# Patient Record
Sex: Male | Born: 2000 | Race: White | Hispanic: No | Marital: Single | Smoking: Never smoker
Health system: Southern US, Community
[De-identification: ages and names within clinical notes are randomized; demographics above are authoritative.]

## PROBLEM LIST (undated history)

## (undated) DIAGNOSIS — F909 Attention-deficit hyperactivity disorder, unspecified type: Secondary | ICD-10-CM

## (undated) DIAGNOSIS — E079 Disorder of thyroid, unspecified: Secondary | ICD-10-CM

## (undated) DIAGNOSIS — J189 Pneumonia, unspecified organism: Secondary | ICD-10-CM

## (undated) HISTORY — PX: ORCHIECTOMY: SHX2116

---
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
## 2022-04-22 ENCOUNTER — Other Ambulatory Visit (INDEPENDENT_AMBULATORY_CARE_PROVIDER_SITE_OTHER): Payer: Self-pay | Admitting: Family

## 2022-06-23 ENCOUNTER — Emergency Department (HOSPITAL_BASED_OUTPATIENT_CLINIC_OR_DEPARTMENT_OTHER): Payer: Medicaid Other

## 2022-06-23 ENCOUNTER — Emergency Department (HOSPITAL_BASED_OUTPATIENT_CLINIC_OR_DEPARTMENT_OTHER)
Admission: EM | Admit: 2022-06-23 | Discharge: 2022-06-23 | Disposition: A | Discharge status: Home / Self Care | Payer: Medicaid Other | Attending: Emergency Medicine | Admitting: Emergency Medicine

## 2022-06-23 ENCOUNTER — Other Ambulatory Visit: Payer: Self-pay

## 2022-06-23 ENCOUNTER — Encounter (HOSPITAL_BASED_OUTPATIENT_CLINIC_OR_DEPARTMENT_OTHER): Payer: Self-pay | Admitting: Emergency Medicine

## 2022-06-23 DIAGNOSIS — S8992XA Unspecified injury of left lower leg, initial encounter: Secondary | ICD-10-CM | POA: Insufficient documentation

## 2022-06-23 DIAGNOSIS — S6992XA Unspecified injury of left wrist, hand and finger(s), initial encounter: Secondary | ICD-10-CM | POA: Diagnosis present

## 2022-06-23 DIAGNOSIS — M25532 Pain in left wrist: Secondary | ICD-10-CM | POA: Diagnosis not present

## 2022-06-23 DIAGNOSIS — W19XXXA Unspecified fall, initial encounter: Secondary | ICD-10-CM | POA: Diagnosis not present

## 2022-06-23 DIAGNOSIS — S52572A Other intraarticular fracture of lower end of left radius, initial encounter for closed fracture: Secondary | ICD-10-CM

## 2022-06-23 HISTORY — DX: Disorder of thyroid, unspecified: E07.9

## 2022-06-23 MED ORDER — OXYCODONE-ACETAMINOPHEN 5-325 MG PO TABS
1.0000 | ORAL_TABLET | Freq: Once | ORAL | Status: AC
  Administered 2022-06-23: 1 via ORAL
  Filled 2022-06-23: qty 1
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
## 2022-06-23 NOTE — ED Notes (Signed)
D/c paperwork reviewed with pt, including f/u care. Pt verbalized understanding, no questions or concerns at time of d/c. Ambulatory to ED exit to meet ride.
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
## 2022-06-23 NOTE — ED Notes (Signed)
X-ray at bedside

## 2022-06-24 ENCOUNTER — Ambulatory Visit (INDEPENDENT_AMBULATORY_CARE_PROVIDER_SITE_OTHER): Payer: Medicaid Other | Admitting: Physician Assistant

## 2022-06-24 ENCOUNTER — Encounter: Payer: Self-pay | Admitting: Physician Assistant

## 2022-06-24 ENCOUNTER — Telehealth (HOSPITAL_BASED_OUTPATIENT_CLINIC_OR_DEPARTMENT_OTHER): Payer: Self-pay | Admitting: Emergency Medicine

## 2022-06-24 DIAGNOSIS — S52502A Unspecified fracture of the lower end of left radius, initial encounter for closed fracture: Secondary | ICD-10-CM | POA: Diagnosis not present

## 2022-06-24 MED ORDER — HYDROCODONE-ACETAMINOPHEN 5-325 MG PO TABS: 1.0000 | ORAL_TABLET | Freq: Four times a day (QID) | ORAL | 0 refills | Status: DC | PRN
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
## 2022-06-26 ENCOUNTER — Ambulatory Visit
Admission: RE | Admit: 2022-06-26 | Discharge: 2022-06-26 | Disposition: A | Discharge status: Home / Self Care | Payer: Medicaid Other | Source: Ambulatory Visit | Attending: Physician Assistant | Admitting: Physician Assistant

## 2022-06-26 ENCOUNTER — Telehealth: Payer: Self-pay

## 2022-06-26 DIAGNOSIS — S52502A Unspecified fracture of the lower end of left radius, initial encounter for closed fracture: Secondary | ICD-10-CM

## 2022-06-26 NOTE — Telephone Encounter (Signed)
Tried to call patient. No answer. LMOM that patient had a CT scan and needs to follow up with Dr.Xu, not Dr.Whitfield. I ask him to call me back so that we can get him scheduled with Dr.Xu.

## 2022-06-27 ENCOUNTER — Ambulatory Visit (INDEPENDENT_AMBULATORY_CARE_PROVIDER_SITE_OTHER): Payer: Medicaid Other | Admitting: Orthopaedic Surgery

## 2022-06-27 ENCOUNTER — Encounter: Payer: Self-pay | Admitting: Orthopaedic Surgery

## 2022-06-27 DIAGNOSIS — S52502A Unspecified fracture of the lower end of left radius, initial encounter for closed fracture: Secondary | ICD-10-CM
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
## 2022-07-02 ENCOUNTER — Encounter: Payer: Self-pay | Admitting: Orthopaedic Surgery

## 2022-07-02 ENCOUNTER — Ambulatory Visit (INDEPENDENT_AMBULATORY_CARE_PROVIDER_SITE_OTHER): Payer: Medicaid Other | Admitting: Orthopaedic Surgery

## 2022-07-02 ENCOUNTER — Ambulatory Visit (INDEPENDENT_AMBULATORY_CARE_PROVIDER_SITE_OTHER): Payer: Medicaid Other

## 2022-07-02 DIAGNOSIS — S52502A Unspecified fracture of the lower end of left radius, initial encounter for closed fracture: Secondary | ICD-10-CM

## 2022-07-02 NOTE — Progress Notes (Signed)
Office Visit Note   Patient: Derrick Howard           Date of Birth: [birth year]           MRN: [id number] Visit Date: 07/02/2022              Requested by: No referring provider defined for this encounter. PCP: Pcp, No   Assessment & Plan: Visit Diagnoses:  1. Closed fracture of distal end of left radius, unspecified fracture morphology, initial encounter     Plan: Impression is 9 days status post left distal radius fracture.  We will immobilize in a long-arm cast to restrict all motion.  We plan to do this for the next 2 weeks.  Follow-up at that time for cast removal and two-view x-rays of the left wrist.  Follow-Up Instructions: Return in about 2 weeks (around 07/16/2022).   Orders:  Orders Placed This Encounter  Procedures   XR Wrist Complete Left   No orders of the defined types were placed in this encounter.     Procedures: No procedures performed   Clinical Data: No additional findings.   Subjective: Chief Complaint  Patient presents with   Left Wrist - Follow-up    HPI Derrick Howard is a 21 year old male who is a referral from [facility] for left distal radius fracture.  He had a mechanical fall about 9 days ago.  CT scan showed relatively nondisplaced intra-articular distal radius fracture.  He is here for fracture follow-up.  Review of Systems  Constitutional: Negative.   All other systems reviewed and are negative.    Objective: Vital Signs: There were no vitals taken for this visit.  Physical Exam Vitals and nursing note reviewed.  Constitutional:      Appearance: He is well-developed.  Pulmonary:     Effort: Pulmonary effort is normal.  Abdominal:     Palpations: Abdomen is soft.  Skin:    General: Skin is warm.  Neurological:     Mental Status: He is alert and oriented to person, place, and time.  Psychiatric:        Behavior: Behavior normal.        Thought Content: Thought content normal.        Judgment: Judgment normal.     Ortho  Exam Examination of the left wrist shows mild swelling and bruising without any neurovascular compromise.  Strong radial pulse. Specialty Comments:  No specialty comments available.  Imaging: No results found.   PMFS History: Patient Active Problem List   Diagnosis Date Noted   Distal radius fracture, left 06/24/2022   Fatty liver 02/11/2020   Hyperlipidemia 02/11/2020   Hypothyroidism 01/12/2020   Goiter 01/12/2020   Flat foot 10/20/2018   ADHD (attention deficit hyperactivity disorder) 03/03/2015   Past Medical History:  Diagnosis Date   ADHD (attention deficit hyperactivity disorder)    Pneumonia    Pneumonia    Thyroid disease     Family History  Problem Relation Age of Onset   Hypertension Maternal Grandmother    Asthma Maternal Grandmother    Cancer Maternal Grandmother    Sleep apnea Maternal Grandmother     Past Surgical History:  Procedure Laterality Date   ORCHIECTOMY     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

## 2022-07-17 ENCOUNTER — Ambulatory Visit (INDEPENDENT_AMBULATORY_CARE_PROVIDER_SITE_OTHER): Payer: Medicaid Other | Admitting: Orthopaedic Surgery

## 2022-07-17 ENCOUNTER — Ambulatory Visit (INDEPENDENT_AMBULATORY_CARE_PROVIDER_SITE_OTHER): Payer: Medicaid Other

## 2022-07-17 DIAGNOSIS — S52502A Unspecified fracture of the lower end of left radius, initial encounter for closed fracture: Secondary | ICD-10-CM | POA: Diagnosis not present

## 2022-07-17 NOTE — Progress Notes (Signed)
   Post-Op Visit Note   Patient: Derrick Howard           Date of Birth: 13-Jul-2001           MRN: [id number] Visit Date: 07/17/2022 PCP: Pcp, No   Assessment & Plan:  Chief Complaint:  Chief Complaint  Patient presents with   Left Wrist - Pain   Visit Diagnoses:  1. Closed fracture of distal end of left radius, unspecified fracture morphology, initial encounter     Plan: Derrick Howard returns today for fracture follow-up.  He is about 3 weeks from the injury.  He has no real complaints today.  Examination of the left upper extremity shows mild swelling.  Expected stiffness from immobilization.  The strays demonstrate good fracture healing.  At this point we will place him in a short arm cast to be worn for the next 2 weeks.  Follow-up at that time for cast removal and repeat two-view x-rays of the left wrist.  Anticipate starting therapy at that time.  Follow-Up Instructions: Return in about 2 weeks (around 07/31/2022).   Orders:  Orders Placed This Encounter  Procedures   XR Wrist 2 Views Left   No orders of the defined types were placed in this encounter.   Imaging: XR Wrist 2 Views Left  Result Date: 07/17/2022 2 view x-rays of the left wrist shows stable alignment of radial styloid fracture without any interval displacement were seen.  The fracture appears to be healing and consolidating well.   PMFS History: Patient Active Problem List   Diagnosis Date Noted   Distal radius fracture, left 06/24/2022   Fatty liver 02/11/2020   Hyperlipidemia 02/11/2020   Hypothyroidism 01/12/2020   Goiter 01/12/2020   Flat foot 10/20/2018   ADHD (attention deficit hyperactivity disorder) 03/03/2015   Past Medical History:  Diagnosis Date   ADHD (attention deficit hyperactivity disorder)    Pneumonia    Pneumonia    Thyroid disease     Family History  Problem Relation Age of Onset   Hypertension Maternal Grandmother    Asthma Maternal Grandmother    Cancer Maternal  Grandmother    Sleep apnea Maternal Grandmother     Past Surgical History:  Procedure Laterality Date   ORCHIECTOMY     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

## 2022-07-31 ENCOUNTER — Ambulatory Visit (INDEPENDENT_AMBULATORY_CARE_PROVIDER_SITE_OTHER): Payer: Medicaid Other | Admitting: Orthopaedic Surgery

## 2022-07-31 ENCOUNTER — Encounter: Payer: Self-pay | Admitting: Orthopaedic Surgery

## 2022-07-31 ENCOUNTER — Ambulatory Visit (INDEPENDENT_AMBULATORY_CARE_PROVIDER_SITE_OTHER): Payer: Medicaid Other

## 2022-07-31 DIAGNOSIS — S52502A Unspecified fracture of the lower end of left radius, initial encounter for closed fracture: Secondary | ICD-10-CM | POA: Diagnosis not present

## 2022-07-31 NOTE — Progress Notes (Signed)
   Office Visit Note   Patient: Derrick Howard           Date of Birth: 11-28-2000           MRN: [id number] Visit Date: 07/31/2022              Requested by: No referring provider defined for this encounter. PCP: Pcp, No   Assessment & Plan: Visit Diagnoses:  1. Closed fracture of distal end of left radius, unspecified fracture morphology, initial encounter     Plan: Impression is healed left distal radius fracture.  Patient is clinically doing well although he still has some limitation with range of motion secondary to stiffness.  I like to start him in hand therapy.  Referrals been made.  He will follow-up with [name] as needed.  Follow-Up Instructions: Return if symptoms worsen or fail to improve.   Orders:  Orders Placed This Encounter  Procedures   XR Wrist 2 Views Left   Ambulatory referral to Occupational Therapy   No orders of the defined types were placed in this encounter.     Procedures: No procedures performed   Clinical Data: No additional findings.   Subjective: Chief Complaint  Patient presents with   Left Wrist - Follow-up    Distal radius fracture    HPI patient is a pleasant 21 year old gentleman who comes in today approximately 5 weeks status post left distal radius fracture.  He has been doing well.  He has been compliant wearing his cast.  He is in no pain.     Objective: Vital Signs: There were no vitals taken for this visit.    Ortho Exam left wrist exam shows no swelling.  No tenderness to fracture site.  Somewhat limited range of motion due to stiffness.  He is neurovascular intact distally.  Specialty Comments:  No specialty comments available.  Imaging: XR Wrist 2 Views Left  Result Date: 07/31/2022 X-rays demonstrate consolidation of the fracture site    PMFS History: Patient Active Problem List   Diagnosis Date Noted   Distal radius fracture, left 06/24/2022   Fatty liver 02/11/2020   Hyperlipidemia 02/11/2020    Hypothyroidism 01/12/2020   Goiter 01/12/2020   Flat foot 10/20/2018   ADHD (attention deficit hyperactivity disorder) 03/03/2015   Past Medical History:  Diagnosis Date   ADHD (attention deficit hyperactivity disorder)    Pneumonia    Pneumonia    Thyroid disease     Family History  Problem Relation Age of Onset   Hypertension Maternal Grandmother    Asthma Maternal Grandmother    Cancer Maternal Grandmother    Sleep apnea Maternal Grandmother     Past Surgical History:  Procedure Laterality Date   ORCHIECTOMY     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

## 2022-08-10 ENCOUNTER — Encounter (HOSPITAL_COMMUNITY): Payer: Self-pay

## 2022-08-10 ENCOUNTER — Ambulatory Visit (HOSPITAL_COMMUNITY)
Admission: EM | Admit: 2022-08-10 | Discharge: 2022-08-10 | Disposition: A | Discharge status: Home / Self Care | Payer: Medicaid Other | Attending: Family Medicine | Admitting: Family Medicine

## 2022-08-10 DIAGNOSIS — B9689 Other specified bacterial agents as the cause of diseases classified elsewhere: Secondary | ICD-10-CM

## 2022-08-10 DIAGNOSIS — L089 Local infection of the skin and subcutaneous tissue, unspecified: Secondary | ICD-10-CM

## 2022-08-10 MED ORDER — DOXYCYCLINE HYCLATE 100 MG PO CAPS: 100.0000 mg | ORAL_CAPSULE | Freq: Two times a day (BID) | ORAL | 0 refills | Status: DC

## 2022-08-10 NOTE — ED Triage Notes (Signed)
Patient with c/o "wolf spider" bite to left lower leg.

## 2022-08-12 NOTE — ED Provider Notes (Signed)
[facility]   [id number] 08/10/22 Arrival Time: 1709  ASSESSMENT & PLAN:  1. Localized bacterial skin infection    No abscess. Unroofed thinned skin at center of erythema over L anterior lower leg with and 18g needle. Minimal yellowish to clear fluid. No deeper areas of infection appreciated. Tylenol/Advil if needed.  Discharge Medication List as of 08/10/2022  6:38 PM     START taking these medications   Details  doxycycline (VIBRAMYCIN) 100 MG capsule Take 1 capsule (100 mg total) by mouth 2 (two) times daily., Starting Sat 08/10/2022, Normal         Follow-up Information     [facility].   Specialty: Urgent Care Why: If worsening or failing to improve as anticipated. Contact information: [address] [place] [address] [phone]                Reviewed expectations re: course of current medical issues. Questions answered. Outlined signs and symptoms indicating need for more acute intervention. Understanding verbalized. After Visit Summary given.   SUBJECTIVE: History from: Patient. Derrick Howard is a 21 y.o. male. Reports: Patient with c/o "wolf spider" bite to left lower leg.  Noted couple of d ago. Increasing erythema and pain. No bleeding or drainage. Denies: fever. Normal PO intake without n/v/d.  OBJECTIVE:  Vitals:   08/10/22 1818 08/10/22 1820  BP:  92/65  Pulse: 77   Resp: 18   Temp: 98.4 F (36.9 C)   TempSrc: Oral   SpO2: 97%     General appearance: alert; no distress Lungs: speaks full sentences without difficulty; unlabored Extremities: no edema; anterior LEFT lower leg with approx 1.5 x 1.5 cm area of erythema; hot to touch; with well-defined borders; no fluctuance; approx 0.5 cm area of thinned skin/blister formation at center; no bleeding or drainage Skin: warm and dry Neurologic: normal gait Psychological: alert and cooperative; normal mood and affect    No  Known Allergies  Past Medical History:  Diagnosis Date   ADHD (attention deficit hyperactivity disorder)    Pneumonia    Pneumonia    Thyroid disease    Social History   Socioeconomic History   Marital status: Single    Spouse name: Not on file   Number of children: Not on file   Years of education: Not on file   Highest education level: Not on file  Occupational History   Not on file  Tobacco Use   Smoking status: Never   Smokeless tobacco: Never  Vaping Use   Vaping Use: Never used  Substance and Sexual Activity   Alcohol use: Yes   Drug use: Yes    Frequency: 2.0 times per week    Types: Marijuana   Sexual activity: Never  Other Topics Concern   Not on file  Social History Narrative   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 No plans for the fall yet.   Playing games. 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

## 2022-09-02 ENCOUNTER — Ambulatory Visit: Payer: Medicaid Other | Admitting: Rehabilitative and Restorative Service Providers"
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
## 2022-09-09 ENCOUNTER — Ambulatory Visit: Payer: Medicaid Other | Attending: Physician Assistant | Admitting: Rehabilitative and Restorative Service Providers"

## 2022-09-09 ENCOUNTER — Other Ambulatory Visit (INDEPENDENT_AMBULATORY_CARE_PROVIDER_SITE_OTHER): Payer: Self-pay | Admitting: Family

## 2022-09-09 ENCOUNTER — Encounter: Payer: Self-pay | Admitting: Rehabilitative and Restorative Service Providers"

## 2022-09-09 ENCOUNTER — Other Ambulatory Visit: Payer: Self-pay

## 2022-09-09 DIAGNOSIS — M6281 Muscle weakness (generalized): Secondary | ICD-10-CM | POA: Diagnosis present

## 2022-09-09 DIAGNOSIS — M25632 Stiffness of left wrist, not elsewhere classified: Secondary | ICD-10-CM | POA: Insufficient documentation

## 2022-09-11 ENCOUNTER — Other Ambulatory Visit (INDEPENDENT_AMBULATORY_CARE_PROVIDER_SITE_OTHER): Payer: Self-pay | Admitting: Family

## 2022-09-12 ENCOUNTER — Telehealth (INDEPENDENT_AMBULATORY_CARE_PROVIDER_SITE_OTHER): Payer: Self-pay | Admitting: Family

## 2022-09-12 NOTE — Telephone Encounter (Signed)
Called Derrick Howard to let him know he needs to make a follow up appointment for refills. Its been almost a year since his last appointment. Grandmother is not on the DPR. We can't speak with her.

## 2022-09-12 NOTE — Telephone Encounter (Signed)
Who's calling (name and relationship to patient) : Delorise Jackson; grandmother  Best contact number: [phone]  Provider they see: Leafy Ro, Np  Reason for call: Grandmother has call stating that the pharmacy has sent in multiple request to refill [name]'s Levthyroxine. Santiago Glad has also stated that he has been without meds for 2 days.   Call ID:      PRESCRIPTION REFILL ONLY  Name of prescription:  Pharmacy:

## 2022-09-13 ENCOUNTER — Encounter (INDEPENDENT_AMBULATORY_CARE_PROVIDER_SITE_OTHER): Payer: Self-pay | Admitting: Family

## 2022-09-13 ENCOUNTER — Ambulatory Visit (INDEPENDENT_AMBULATORY_CARE_PROVIDER_SITE_OTHER): Payer: Medicaid Other | Admitting: Family

## 2022-09-13 VITALS — BP 110/68 | HR 74 | Wt 185.2 lb

## 2022-09-13 DIAGNOSIS — E039 Hypothyroidism, unspecified: Secondary | ICD-10-CM | POA: Diagnosis not present

## 2022-09-13 DIAGNOSIS — Z833 Family history of diabetes mellitus: Secondary | ICD-10-CM

## 2022-09-13 LAB — POCT GLUCOSE (DEVICE FOR HOME USE): POC Glucose: 110 mg/dl — AB (ref 70–99)

## 2022-09-13 LAB — POCT GLYCOSYLATED HEMOGLOBIN (HGB A1C): Hemoglobin A1C: 5 % (ref 4.0–5.6)

## 2022-09-13 MED ORDER — LEVOTHYROXINE SODIUM 137 MCG PO TABS: 68.0000 ug | ORAL_TABLET | Freq: Every day | ORAL | 0 refills | Status: DC

## 2022-09-13 NOTE — Progress Notes (Signed)
Pediatric Endocrinology Consultation Initial Visit  Derrick, Howard 2001/05/28  Pcp, No  Chief Complaint: Elevated TSH. Weight gain   History obtained from: Derrick Howard and his Grandmother, and review of records from PCP  HPI: Derrick Howard  is a 22 y.o. male being seen in consultation at the request of  Pcp, No for evaluation of the above concerns.  he is accompanied to this visit by his Grandmother.   1.  Derrick Howard was seen by his PCP on 11/2019 for a [facility] where he was noted to have significant weight gain of over 50 pounds in 1 year period along with fatigue. Labs were drawn which showed elevated TSH of 5.10 with normal FT4 of 1.1 and negative TPO antibody.   he is [facility] to [facility] ([facility]ediatric [facility]inology) for further evaluation elevated TSH and weight gain.  Growth Chart from PCP was reviewed and showed weight was at 129 lbs on 08/2018 which was 49th%ile. On 11/2019 weight has increased to 183 lbs and is 91.45%ile. He has had good height growth currently 16.63%ile which is on track for MPH.     2. Since his last visit to clinic on 11/2021, he has been well.   He plans to start community college in the fall. Currently working at [employer] a few days per week.   Reports he ran out of levothyroxine about 4 days ago due to dropping his medication and losing some of the tablets. He is taking 68 mcg of levothyroxine per day, in the morning on empty stomach.    Thyroid symptoms: Heat or cold intolerance: Denies  Weight changes: Weight has increased 3lb since last visit.  Energy level: good except he has been tired since running out of levothyroxine.  Sleep: good Skin changes: No   Constipation/Diarrhea: No  Difficulty swallowing: no  Neck swelling: no  Diet: Reports he has not been eating as healthy lately. Plans to start working on his diet again.   Exercise: Goes for walks every other day for 1-2 miles.     ROS: All systems reviewed with pertinent positives  listed below; otherwise negative. Constitutional: Weight as above.  Sleeping well HEENT: No vision changes. No neck pain. No difficulty swallowing.  Respiratory: No increased work of breathing currently Cardiac: No tachycardia. No chest pain.  GI: No constipation or diarrhea GU:  No polyuria.  Musculoskeletal: No joint deformity Neuro: Normal affect. No tremors or headache.  Endocrine: As above   Past Medical History:  Past Medical History:  Diagnosis Date   ADHD (attention deficit hyperactivity disorder)    Pneumonia    Pneumonia    Thyroid disease     Birth History: Pregnancy uncomplicated. Delivered at term Discharged home with mom  Meds: Outpatient Encounter Medications as of 09/13/2022  Medication Sig   levothyroxine (SYNTHROID) 137 MCG tablet TAKE 0.5 TABLETS (68 MCG TOTAL) BY MOUTH DAILY BEFORE BREAKFAST.   doxycycline (VIBRAMYCIN) 100 MG capsule Take 1 capsule (100 mg total) by mouth 2 (two) times daily. (Patient not taking: Reported on 09/09/2022)   No facility-administered encounter medications on file as of 09/13/2022.    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  Hypothyroidism in Paternal Grandmother   Maternal height: 47ft 2in, Paternal height 60ft 10in    Social History: Lives with: Maternal Grandmother  Working.   Physical Exam:  Vitals:   09/13/22 1049  BP: 110/68  Pulse: 74  Weight: 185 lb 3.2 oz (84 kg)       Body mass index: body mass index is 29.89 kg/m. Blood pressure %iles are not available for patients who are 18 years or older.  Wt Readings from Last 3 Encounters:  09/13/22 185 lb 3.2 oz (84 kg) (85 %, Z= 1.04)*  06/23/22 182 lb (82.6 kg) (83 %, Z= 0.97)*  12/11/21 181 lb 3.2 oz (82.2 kg) (84 %, Z= 1.01)*   * Growth  percentiles are based on CDC (Boys, 2-20 Years) data.   Ht Readings from Last 3 Encounters:  06/23/22 5\' 6"  (1.676 m) (10 %, Z= -1.26)*  06/11/21 5' 7.09" (1.704 m) (20 %, Z= -0.83)*  01/25/21 5' 7.09" (1.704 m) (21 %, Z= -0.79)*   * Growth percentiles are based on CDC (Boys, 2-20 Years) data.     85 %ile (Z= 1.04) based on CDC (Boys, 2-20 Years) weight-for-age data using vitals from 09/13/2022. No height on file for this encounter. 95 %ile (Z= 1.60) based on CDC (Boys, 2-20 Years) BMI-for-age data using weight from 09/13/2022 and height from 06/23/2022.  General: Well developed, well nourished male in no acute distress.   Head: Normocephalic, atraumatic.   Eyes:  Pupils equal and round. EOMI.  Sclera white.  No eye drainage.   Ears/Nose/Mouth/Throat: Nares patent, no nasal drainage.  Normal dentition, mucous membranes moist.  Neck: supple, no cervical lymphadenopathy, no thyromegaly Cardiovascular: regular rate, normal S1/S2, no murmurs Respiratory: No increased work of breathing.  Lungs clear to auscultation bilaterally.  No wheezes. Abdomen: soft, nontender, nondistended. Normal bowel sounds.  No appreciable masses  Extremities: warm, well perfused, cap refill < 2 sec.   Musculoskeletal: Normal muscle mass.  Normal strength Skin: warm, dry.  No rash or lesions. Neurologic: alert and oriented, normal speech, no tremor    Laboratory Evaluation: Results for orders placed or performed in visit on 09/13/22  POCT glycosylated hemoglobin (Hb A1C)  Result Value Ref Range   Hemoglobin A1C 5.0 4.0 - 5.6 %   HbA1c POC (<> result, manual entry)     HbA1c, POC (prediabetic range)     HbA1c, POC (controlled diabetic range)    POCT Glucose (Device for Home Use)  Result Value Ref Range   Glucose Fasting, POC     POC Glucose 110 (A) 70 - 99 mg/dl     Assessment/Plan: Derrick Howard is a 22 y.o. male with Derrick Howard is a 22 y.o. male with hypothyroidism. Derrick Howard has continued  healthy lifestyle habits, hemoglobin A1c is 5% today. He is clinically euthyroid on 68 mcg of levothyroxine per day.   Hypothyroidism  - TSH, FT4 and T4 ordered  - 68 mcg of levothyroxine per day  - Reviewed growth chart  - Discussed s/s of hypothyroidism.   2. Family history of T2DM - Hemoglobin A1c and glucose as above.   Follow-up:   6 months.   Medical decision-making:  >30  spent today reviewing the medical chart, counseling the patient/family, and documenting today's visit.    Hermenia Bers,  FNP-C  [facility]ist  [address] [place]  [place], [address]  Tele: [phone]

## 2022-09-13 NOTE — Patient Instructions (Signed)

## 2022-09-14 LAB — T4, FREE: Free T4: 1.1 ng/dL (ref 0.8–1.4)

## 2022-09-14 LAB — T4: T4, Total: 7.7 ug/dL (ref 5.1–10.3)

## 2022-09-14 LAB — TSH: TSH: 5.66 mIU/L — ABNORMAL HIGH (ref 0.50–4.30)

## 2022-10-12 ENCOUNTER — Other Ambulatory Visit (INDEPENDENT_AMBULATORY_CARE_PROVIDER_SITE_OTHER): Payer: Self-pay | Admitting: Family

## 2022-10-17 ENCOUNTER — Encounter (HOSPITAL_COMMUNITY): Payer: Self-pay | Admitting: Emergency Medicine

## 2022-10-17 ENCOUNTER — Ambulatory Visit (HOSPITAL_COMMUNITY)
Admission: EM | Admit: 2022-10-17 | Discharge: 2022-10-17 | Disposition: A | Discharge status: Home / Self Care | Payer: Medicaid Other | Attending: Internal Medicine | Admitting: Internal Medicine

## 2022-10-17 DIAGNOSIS — W57XXXA Bitten or stung by nonvenomous insect and other nonvenomous arthropods, initial encounter: Secondary | ICD-10-CM

## 2022-10-17 DIAGNOSIS — S80862A Insect bite (nonvenomous), left lower leg, initial encounter: Secondary | ICD-10-CM | POA: Diagnosis not present

## 2022-10-17 MED ORDER — KETOROLAC TROMETHAMINE 30 MG/ML IJ SOLN: 30.0000 mg | Freq: Once | INTRAMUSCULAR | Status: DC

## 2022-10-17 MED ORDER — DOXYCYCLINE HYCLATE 100 MG PO CAPS: 100.0000 mg | ORAL_CAPSULE | Freq: Two times a day (BID) | ORAL | 0 refills | Status: AC

## 2022-10-17 NOTE — ED Triage Notes (Addendum)
Pt has scabbed over bump on LLE for a month. Denies drainage. Reports gotten larger. Denies pain.

## 2022-10-17 NOTE — Discharge Instructions (Addendum)
Take doxycycline twice daily for the next 7 days. Monitor the site for further signs of infection (worsening swelling, redness, pus, or pain). Follow-up with your primary care provider as needed.  If you develop any new or worsening symptoms or do not improve in the next 2 to 3 days, please return.  If your symptoms are severe, please go to the emergency room.  Follow-up with your primary care provider for further evaluation and management of your symptoms as well as ongoing wellness visits.  I hope you feel better!

## 2022-10-17 NOTE — ED Provider Notes (Signed)
[place]    CSN: [id number] Arrival date & time: 10/17/22  1850      History   Chief Complaint Chief Complaint  Patient presents with   Insect Bite    HPI Derrick Howard is a 22 y.o. male.   Patient presents to urgent care for evaluation of insect bite to the left lower extremity that he states he first noticed approximately 1 week ago. The site has grown in size, pain, and discomfort over the last few days. He is unsure of what may have bit him, however he spends a lot of time outside. He feels confident that this was not a tick bite but is concerned this may be a spider bite. Denies allergies to antibiotics and recent antibiotic/steroid use. He has not been using any over the counter medications. States the area is slightly uncomfortable, painful, and itchy. Denies recent known exposure to poisonous plants or other allergens. No fever, chills, nausea, vomiting, fatigue, viral URI symptoms, body aches, or dizziness.      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 Authorizing Provider  doxycycline (VIBRAMYCIN) 100 MG capsule Take 1 capsule (100 mg total) by mouth 2 (two) times daily for 7 days. 10/17/22 10/24/22  Talbot Grumbling, FNP  levothyroxine (SYNTHROID) 137 MCG tablet TAKE 0.5 TABLETS (68 MCG TOTAL) BY MOUTH DAILY BEFORE BREAKFAST. 10/14/22    Hermenia Bers, NP    Family History Family History  Problem Relation Age of Onset   Hypertension Maternal Grandmother    Asthma Maternal Grandmother    Cancer Maternal Grandmother    Sleep apnea Maternal Grandmother     Social History Social History   Tobacco Use   Smoking status: Never   Smokeless tobacco: Never  Vaping Use   Vaping Use: Never used  Substance Use Topics   Alcohol use: Yes   Drug use: Yes    Frequency: 2.0 times per week    Types: Marijuana     Allergies   Patient has no known allergies.   Review of Systems Review of Systems Per HPI  Physical Exam Triage Vital Signs ED Triage Vitals  Enc Vitals Group     BP 10/17/22 1943 114/79     Pulse Rate 10/17/22 1943 75     Resp 10/17/22 1943 17     Temp 10/17/22 1943 98.8 F (37.1 C)     Temp src --      SpO2 10/17/22 1943 97 %     Weight --      Height --      Head Circumference --      Peak Flow --      Pain Score 10/17/22 1942 0     Pain Loc --      Pain Edu? --  Excl. in GC? --    No data found.  Updated Vital Signs BP 114/79 (BP Location: Right Arm)   Pulse 75   Temp 98.8 F (37.1 C)   Resp 17   SpO2 97%   Visual Acuity Right Eye Distance:   Left Eye Distance:   Bilateral Distance:    Right Eye Near:   Left Eye Near:    Bilateral Near:     Physical Exam Vitals and nursing note reviewed.  Constitutional:      Appearance: He is not ill-appearing or toxic-appearing.  HENT:     Head: Normocephalic and atraumatic.     Right Ear: Hearing and external ear normal.     Left Ear: Hearing and external ear normal.     Nose: Nose normal.     Mouth/Throat:     Lips: Pink.  Eyes:     General: Lids are normal. Vision grossly intact. Gaze aligned appropriately.     Extraocular Movements: Extraocular movements intact.     Conjunctiva/sclera: Conjunctivae normal.  Pulmonary:     Effort: Pulmonary effort is normal.  Musculoskeletal:     Cervical back: Neck supple.  Skin:     General: Skin is warm and dry.     Capillary Refill: Capillary refill takes less than 2 seconds.     Findings: Lesion present. No rash.     Comments: Raised papule with surrounding are of erythema to the left anterior shin as seen in image below. Lesion is warm to touch and non-draining. Area of swelling underneath lesion is non-fluctuant and tender to palpation. +2 anterior tibialis pulses bilaterally. Sensation intact distal to insect bite.  Neurological:     General: No focal deficit present.     Mental Status: He is alert and oriented to person, place, and time. Mental status is at baseline.     Cranial Nerves: No dysarthria or facial asymmetry.  Psychiatric:        Mood and Affect: Mood normal.        Speech: Speech normal.        Behavior: Behavior normal.        Thought Content: Thought content normal.        Judgment: Judgment normal.    Left anterior shin   UC Treatments / Results  Labs (all labs ordered are listed, but only abnormal results are displayed) Labs Reviewed - No data to display  EKG   Radiology No results found.  Procedures Procedures (including critical care time)  Medications Ordered in UC Medications - No data to display  Initial Impression / Assessment and Plan / UC Course  I have reviewed the triage vital signs and the nursing notes.  Pertinent labs & imaging results that were available during my care of the patient were reviewed by me and considered in my medical decision making (see chart for details).   1. Insect bite of lower leg Doxycycline antibiotic to be taken as prescribed to treat infection. Warm compresses frequently recommended. Ibuprofen and tylenol as needed for pain. Worsening signs of infection discussed, may follow-up with urgent care or PCP as needed. Not exhibiting systemic signs of infection concerning for need for ED referral.   Discussed physical exam and available lab work findings in clinic with patient.  Counseled patient  regarding appropriate use of medications and potential side effects for all medications recommended or prescribed today. Discussed red flag signs and symptoms of worsening condition,when to call the PCP office, return to urgent care,  and when to seek higher level of care in the emergency department. Patient verbalizes understanding and agreement with plan. All questions answered. Patient discharged in stable condition.    Final Clinical Impressions(s) / UC Diagnoses   Final diagnoses:  Insect bite of left lower leg, initial encounter     Discharge Instructions      Take doxycycline twice daily for the next 7 days. Monitor the site for further signs of infection (worsening swelling, redness, pus, or pain). Follow-up with your primary care provider as needed.  If you develop any new or worsening symptoms or do not improve in the next 2 to 3 days, please return.  If your symptoms are severe, please go to the emergency room.  Follow-up with your primary care provider for further evaluation and management of your symptoms as well as ongoing wellness visits.  I hope you feel better!      ED Prescriptions     Medication Sig Dispense Auth. Provider   doxycycline (VIBRAMYCIN) 100 MG capsule Take 1 capsule (100 mg total) by mouth 2 (two) times daily for 7 days. 14 capsule Talbot Grumbling, FNP      PDMP not reviewed this encounter.   Talbot Grumbling, [place] 10/20/22 2141

## 2023-02-02 ENCOUNTER — Encounter (HOSPITAL_COMMUNITY): Payer: Self-pay

## 2023-02-02 ENCOUNTER — Ambulatory Visit (HOSPITAL_COMMUNITY)
Admission: EM | Admit: 2023-02-02 | Discharge: 2023-02-02 | Disposition: A | Discharge status: Home / Self Care | Payer: Medicaid Other | Attending: Emergency Medicine | Admitting: Emergency Medicine

## 2023-02-02 DIAGNOSIS — J069 Acute upper respiratory infection, unspecified: Secondary | ICD-10-CM | POA: Insufficient documentation

## 2023-02-02 DIAGNOSIS — Z1152 Encounter for screening for COVID-19: Secondary | ICD-10-CM | POA: Diagnosis not present

## 2023-02-02 DIAGNOSIS — R059 Cough, unspecified: Secondary | ICD-10-CM | POA: Diagnosis present

## 2023-02-02 DIAGNOSIS — F1290 Cannabis use, unspecified, uncomplicated: Secondary | ICD-10-CM | POA: Diagnosis not present

## 2023-02-02 NOTE — ED Triage Notes (Signed)
Patient here today with c/o cough, headache, ST, nasal drainage, and fever X 3-4 days. His sister's dad was also sick with the same symptoms. He has not been taking any medications for the symptoms.

## 2023-02-02 NOTE — ED Provider Notes (Signed)
[facility]    CSN: [id number] Arrival date & time: 02/02/23  1441      History   Chief Complaint Chief Complaint  Patient presents with   Cough    HPI Derrick Howard is a 22 y.o. male.    patient presents for fever, nasal congestion, rhinorrhea, nonproductive cough and sore throat present for 4 days. fever peaking at 101.  Associated, vomiting and diarrhea, last occurrence of vomiting 4 days ago, last occurrence of diarrhea 2 days ago, described as watery.  Decreased appetite but has been able to tolerate both food and liquids. known sick contacts with similar symptoms. not attempted treatment.  Denies shortness of breath or wheezing. history of pneumonia.   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 Authorizing Provider  levothyroxine (SYNTHROID) 137 MCG tablet TAKE 0.5 TABLETS (68 MCG TOTAL) BY MOUTH DAILY BEFORE BREAKFAST. 10/14/22  Yes Gretchen Short, NP    Family History Family History  Problem Relation Age of Onset   Hypertension Maternal Grandmother    Asthma Maternal Grandmother    Cancer Maternal Grandmother    Sleep apnea Maternal Grandmother     Social History Social History   Tobacco Use   Smoking status: Never   Smokeless tobacco: Never  Vaping Use   Vaping Use: Never used  Substance Use Topics   Alcohol use: Yes   Drug use: Yes     Frequency: 2.0 times per week    Types: Marijuana     Allergies   Patient has no known allergies.   Review of Systems Review of Systems  Constitutional:  Positive for chills and fever. Negative for activity change, appetite change, diaphoresis, fatigue and unexpected weight change.  HENT:  Positive for congestion, rhinorrhea and sore throat. Negative for dental problem, drooling, ear discharge, ear pain, facial swelling, hearing loss, mouth sores, nosebleeds, postnasal drip, sinus pressure, sinus pain, sneezing, tinnitus, trouble swallowing and voice change.   Respiratory:  Positive for cough. Negative for apnea, choking, chest tightness, shortness of breath, wheezing and stridor.   Gastrointestinal:  Positive for abdominal pain, diarrhea and vomiting. Negative for abdominal distention, anal bleeding, blood in stool, constipation, nausea and rectal pain.  Musculoskeletal: Negative.   Skin: Negative.   Neurological:  Positive for headaches. Negative for dizziness, tremors, seizures, syncope, facial asymmetry, speech difficulty, weakness, light-headedness and numbness.     Physical Exam Triage Vital Signs ED Triage Vitals  Enc Vitals Group     BP 02/02/23 1521 114/73     Pulse Rate 02/02/23 1521 87     Resp 02/02/23 1521 16     Temp 02/02/23 1521 98.2 F (36.8 C)     Temp Source 02/02/23 1521 Oral     SpO2 02/02/23 1521 96 %     Weight 02/02/23 1521 177  lb (80.3 kg)     Height 02/02/23 1521 5\' 8"  (1.727 m)     Head Circumference --      Peak Flow --      Pain Score 02/02/23 1520 3     Pain Loc --      Pain Edu? --      Excl. in GC? --    No data found.  Updated Vital Signs BP 114/73 (BP Location: Left Arm)   Pulse 87   Temp 98.2 F (36.8 C) (Oral)   Resp 16   Ht 5\' 8"  (1.727 m)   Wt 177 lb (80.3 kg)   SpO2 96%   BMI 26.91 kg/m   Visual Acuity Right Eye Distance:   Left Eye Distance:   Bilateral Distance:    Right Eye Near:   Left Eye Near:    Bilateral  Near:     Physical Exam Constitutional:      Appearance: Normal appearance.  HENT:     Right Ear: Tympanic membrane, ear canal and external ear normal.     Left Ear: Tympanic membrane, ear canal and external ear normal.     Nose: Congestion present. No rhinorrhea.     Mouth/Throat:     Mouth: Mucous membranes are moist.     Pharynx: No posterior oropharyngeal erythema.  Cardiovascular:     Rate and Rhythm: Normal rate and regular rhythm.     Pulses: Normal pulses.     Heart sounds: Normal heart sounds.  Pulmonary:     Effort: Pulmonary effort is normal.     Breath sounds: Normal breath sounds.  Skin:    General: Skin is warm and dry.  Neurological:     Mental Status: He is alert and oriented to person, place, and time. Mental status is at baseline.      UC Treatments / Results  Labs (all labs ordered are listed, but only abnormal results are displayed) Labs Reviewed - No data to display  EKG   Radiology No results found.  Procedures Procedures (including critical care time)  Medications Ordered in UC Medications - No data to display  Initial Impression / Assessment and Plan / UC Course  I have reviewed the triage vital signs and the nursing notes.  Pertinent labs & imaging results that were available during my care of the patient were reviewed by me and considered in my medical decision making (see chart for details).  Viral URI with cough   Patient is in no signs of distress nor toxic appearing.  Vital signs are stable.  Low suspicion for pneumonia, pneumothorax or bronchitis and therefore will defer imaging.COVID test is pending, reviewed quarantine guidelines per CDC recommendations, does not qualify for antiviral treatment.May use additional over-the-counter medications as needed for supportive care.  May follow-up with urgent care as needed if symptoms persist or worsen.  Note given.   Final Clinical Impressions(s) / UC Diagnoses   Final diagnoses:  None    Discharge Instructions   None    ED Prescriptions   None    PDMP not reviewed this encounter.   Valinda Hoar, NP 02/02/23 1547

## 2023-02-02 NOTE — Discharge Instructions (Addendum)
Your symptoms today are most likely being caused by a virus and should steadily improve in time it can take up to 7 to 10 days before you truly start to see a turnaround however things will get better   COVID test is pending, you will be notified of positive test results only, if positive quarantine guidelines are only if having a fever and wearing masks while having symptoms    You can take Tylenol and/or Ibuprofen as needed for fever reduction and pain relief.   For cough: honey 1/2 to 1 teaspoon (you can dilute the honey in water or another fluid).  You can also use guaifenesin and dextromethorphan for cough. You can use a humidifier for chest congestion and cough.  If you don't have a humidifier, you can sit in the bathroom with the hot shower running.      For sore throat: try warm salt water gargles, cepacol lozenges, throat spray, warm tea or water with lemon/honey, popsicles or ice, or OTC cold relief medicine for throat discomfort.   For congestion: take a daily anti-histamine like Zyrtec, Claritin, and a oral decongestant, such as pseudoephedrine.  You can also use Flonase 1-2 sprays in each nostril daily.   It is important to stay hydrated: drink plenty of fluids (water, gatorade/powerade/pedialyte, juices, or teas) to keep your throat moisturized and help further relieve irritation/discomfort.

## 2023-02-03 LAB — SARS CORONAVIRUS 2 (TAT 6-24 HRS): SARS Coronavirus 2: NEGATIVE

## 2023-03-17 ENCOUNTER — Ambulatory Visit (INDEPENDENT_AMBULATORY_CARE_PROVIDER_SITE_OTHER): Payer: Medicaid Other | Admitting: Family

## 2023-03-17 NOTE — Progress Notes (Deleted)
Pediatric Endocrinology Consultation Initial Visit  Derrick Howard, [place] [birth year]  Pcp, No  Chief Complaint: Elevated TSH. Weight gain   History obtained from: [name] and his Grandmother, and review of records from PCP  HPI: Derrick Howard  is a 22 y.o. male being seen in consultation at the request of  Pcp, No for evaluation of the above concerns.  he is accompanied to this visit by his Grandmother.   1.  Derrick Howard was seen by his PCP on 11/2019 for a [facility] where he was noted to have significant weight gain of over 50 pounds in 1 year period along with fatigue. Labs were drawn which showed elevated TSH of 5.10 with normal FT4 of 1.1 and negative TPO antibody.   he is referred to [facility] (Pediatric Endocrinology) for further evaluation elevated TSH and weight gain.  Growth Chart from PCP was reviewed and showed weight was at 129 lbs on 08/2018 which was 49th%ile. On 11/2019 weight has increased to 183 lbs and is 91.45%ile. He has had good height growth currently 16.63%ile which is on track for M[facility].     2. Since his last visit to clinic on 08/2022, he has been well.   He plans to start community college in the fall. Currently working at [employer] a few days per week.   Reports he ran out of levothyroxine about 4 days ago due to dropping his medication and losing some of the tablets. He is taking 68 mcg of levothyroxine per day, in the morning on empty stomach.    Thyroid symptoms: Heat or cold intolerance: Denies  Weight changes: Weight has increased 3lb since last visit.  Energy level: good except he has been tired since running out of levothyroxine.  Sleep: good Skin changes: No   Constipation/Diarrhea: No  Difficulty swallowing: no  Neck swelling: no  Diet: Reports he has not been eating as healthy lately. Plans to start working on his diet again.   Exercise: Goes for walks every other day for 1-2 miles.     ROS: All systems reviewed with pertinent positives  listed below; otherwise negative. Constitutional: Weight as above.  Sleeping well HEENT: No vision changes. No neck pain. No difficulty swallowing.  Respiratory: No increased work of breathing currently Cardiac: No tachycardia. No chest pain.  GI: No constipation or diarrhea GU:  No polyuria.  Musculoskeletal: No joint deformity Neuro: Normal affect. No tremors or headache.  Endocrine: As above   Past Medical History:  Past Medical History:  Diagnosis Date   ADHD (attention deficit hyperactivity disorder)    Pneumonia    Pneumonia    Thyroid disease     Birth History: Pregnancy uncomplicated. Delivered at term Discharged home with mom  Meds: Outpatient Encounter Medications as of 03/17/2023  Medication Sig   levothyroxine (SYNTHROID) 137 MCG tablet TAKE 0.5 TABLETS (68 MCG TOTAL) BY MOUTH DAILY BEFORE BREAKFAST.   No facility-administered encounter medications on file as of 03/17/2023.    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  Hypothyroidism in Paternal Grandmother   Maternal height: 54ft 2in, Paternal height 46ft 10in    Social History: Lives with: Maternal Grandmother  Working.   Physical Exam:  There were no vitals filed for this visit.  Body mass index: body mass index is unknown because  there is no height or weight on file. Growth %ile SmartLinks can only be used for patients less than 46 years old.  Wt Readings from Last 3 Encounters:  02/02/23 177 lb (80.3 kg) (77%, Z= 0.75)*  09/13/22 185 lb 3.2 oz (84 kg) (85%, Z= 1.04)*  06/23/22 182 lb (82.6 kg) (83%, Z= 0.97)*   * Growth percentiles are based on CDC (Boys, 2-20 Years) data.   Ht Readings from Last 3 Encounters:  02/02/23 5\' 8"  (1.727 m) (28%, Z= -0.58)*  06/23/22 5\' 6"  (1.676 m) (10%, Z=  -1.26)*  06/11/21 5' 7.09" (1.704 m) (20%, Z= -0.83)*   * Growth percentiles are based on CDC (Boys, 2-20 Years) data.     Facility age limit for growth %iles is 20 years. Facility age limit for growth %iles is 20 years. No height and weight on file for this encounter.  General: Well developed, well nourished male in no acute distress. Head: Normocephalic, atraumatic.   Eyes:  Pupils equal and round. EOMI.  Sclera white.  No eye drainage.   Ears/Nose/Mouth/Throat: Nares patent, no nasal drainage.  Normal dentition, mucous membranes moist.  Neck: supple, no cervical lymphadenopathy, no thyromegaly Cardiovascular: regular rate, normal S1/S2, no murmurs Respiratory: No increased work of breathing.  Lungs clear to auscultation bilaterally.  No wheezes. Abdomen: soft, nontender, nondistended. Normal bowel sounds.  No appreciable masses  Extremities: warm, well perfused, cap refill < 2 sec.   Musculoskeletal: Normal muscle mass.  Normal strength Skin: warm, dry.  No rash or lesions. Neurologic: alert and oriented, normal speech, no tremor   Laboratory Evaluation: Results for orders placed or performed during the hospital encounter of 02/02/23  SARS CORONAVIRUS 2 (TAT 6-24 HRS) Anterior Nasal Swab   Specimen: Anterior Nasal Swab  Result Value Ref Range   SARS Coronavirus 2 NEGATIVE NEGATIVE     Assessment/Plan: Derrick Howard is a 22 y.o. male with Derrick Howard is a 22 y.o. male with hypothyroidism. Derrick Howard has continued healthy lifestyle habits, hemoglobin A1c is 5% today. He is clinically euthyroid on 68 mcg of levothyroxine per day.   Hypothyroidism  -68 mcg of levothyroxine per day  - TSH, FT4 and T4 ordered  - discussed s/s of hypothyroidism> encouraged to contact me between visits if needed.    2. Family history of T2DM - Hemoglobin A1c and glucose as above.   Follow-up:   6 months.   Medical decision-making:  >30  spent today reviewing the medical chart, counseling  the patient/family, and documenting today's visit.    Gretchen Short,  FNP-C  [facility] Specialist  [address]  [place], [address]  Tele: [phone]

## 2023-03-24 ENCOUNTER — Telehealth (INDEPENDENT_AMBULATORY_CARE_PROVIDER_SITE_OTHER): Payer: Self-pay | Admitting: Family

## 2023-03-24 DIAGNOSIS — E039 Hypothyroidism, unspecified: Secondary | ICD-10-CM

## 2023-03-24 MED ORDER — LEVOTHYROXINE SODIUM 137 MCG PO TABS: 68.0000 ug | ORAL_TABLET | Freq: Every day | ORAL | 1 refills | Status: DC

## 2023-03-24 NOTE — Telephone Encounter (Signed)
Who's calling (name and relationship to patient) : Berman Gurski; self  Best contact number: [phone]  Provider they see: Dalbert Garnet, Np   Reason for call: [name] has called in wanting thyroid meds to be sent in to [facility]. He stated that he has been aout for about 2 wks.    Call ID:      PRESCRIPTION REFILL ONLY  Name of prescription:  Pharmacy:  [facility] [address]

## 2023-03-31 ENCOUNTER — Ambulatory Visit (INDEPENDENT_AMBULATORY_CARE_PROVIDER_SITE_OTHER): Payer: Medicaid Other | Admitting: Family

## 2023-03-31 NOTE — Progress Notes (Deleted)
Pediatric Endocrinology Consultation Initial Visit  Fernandez, Weitzel [birth year]  Pcp, No  Chief Complaint: Elevated TSH. Weight gain   History obtained from: Derrick Howard and his Grandmother, and review of records from PCP  HPI: Derrick Howard  is a 22 y.o. male being seen in consultation at the request of  Pcp, No for evaluation of the above concerns.  he is accompanied to this visit by his Grandmother.   1.  Derrick Howard was seen by his PCP on 11/2019 for a [facility] where he was noted to have significant weight gain of over 50 pounds in 1 year period along with fatigue. Labs were drawn which showed elevated TSH of 5.10 with normal FT4 of 1.1 and negative TPO antibody.   he is referred to [facility] (Pediatric Endocrinology) for further evaluation elevated TSH and weight gain.  Growth Chart from PCP was reviewed and showed weight was at 129 lbs on 08/2018 which was 49th%ile. On 11/2019 weight has increased to 183 lbs and is 91.45%ile. He has had good height growth currently 16.63%ile which is on track for MPH.     2. Since his last visit to clinic on 08/2022, he has been well.   He plans to start community college in the fall. Currently working at [employer] a few days per week.   Reports he ran out of levothyroxine about 4 days ago due to dropping his medication and losing some of the tablets. He is taking 68 mcg of levothyroxine per day, in the morning on empty stomach.    Thyroid symptoms: Heat or cold intolerance: Denies  Weight changes: Weight has increased 3lb since last visit.  Energy level: good except he has been tired since running out of levothyroxine.  Sleep: good Skin changes: No   Constipation/Diarrhea: No  Difficulty swallowing: no  Neck swelling: no  Diet: Reports he has not been eating as healthy lately. Plans to start working on his diet again.   Exercise: Goes for walks every other day for 1-2 miles.     ROS: All systems reviewed with pertinent positives  listed below; otherwise negative. Constitutional: Weight as above.  Sleeping well HEENT: No vision changes. No neck pain. No difficulty swallowing.  Respiratory: No increased work of breathing currently Cardiac: No tachycardia. No chest pain.  GI: No constipation or diarrhea GU:  No polyuria.  Musculoskeletal: No joint deformity Neuro: Normal affect. No tremors or headache.  Endocrine: As above   Past Medical History:  Past Medical History:  Diagnosis Date   ADHD (attention deficit hyperactivity disorder)    Pneumonia    Pneumonia    Thyroid disease     Birth History: Pregnancy uncomplicated. Delivered at term Discharged home with mom  Meds: Outpatient Encounter Medications as of 03/31/2023  Medication Sig   levothyroxine (SYNTHROID) 137 MCG tablet Take 0.5 tablets (68 mcg total) by mouth daily before breakfast.   No facility-administered encounter medications on file as of 03/31/2023.    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  Hypothyroidism in Paternal Grandmother   Maternal height: 13ft 2in, Paternal height 32ft 10in    Social History: Lives with: Maternal Grandmother  Working.   Physical Exam:  There were no vitals filed for this visit.  Body mass index: body mass index is unknown because  there is no height or weight on file. Growth %ile SmartLinks can only be used for patients less than 57 years old.  Wt Readings from Last 3 Encounters:  02/02/23 177 lb (80.3 kg) (77%, Z= 0.75)*  09/13/22 185 lb 3.2 oz (84 kg) (85%, Z= 1.04)*  06/23/22 182 lb (82.6 kg) (83%, Z= 0.97)*   * Growth percentiles are based on CDC (Boys, 2-20 Years) data.   Ht Readings from Last 3 Encounters:  02/02/23 5\' 8"  (1.727 m) (28%, Z= -0.58)*  06/23/22 5\' 6"  (1.676 m) (10%, Z=  -1.26)*  06/11/21 5' 7.09" (1.704 m) (20%, Z= -0.83)*   * Growth percentiles are based on CDC (Boys, 2-20 Years) data.     Facility age limit for growth %iles is 20 years. Facility age limit for growth %iles is 20 years. No height and weight on file for this encounter.  General: Well developed, well nourished male in no acute distress. Head: Normocephalic, atraumatic.   Eyes:  Pupils equal and round. EOMI.  Sclera white.  No eye drainage.   Ears/Nose/Mouth/Throat: Nares patent, no nasal drainage.  Normal dentition, mucous membranes moist.  Neck: supple, no cervical lymphadenopathy, no thyromegaly Cardiovascular: regular rate, normal S1/S2, no murmurs Respiratory: No increased work of breathing.  Lungs clear to auscultation bilaterally.  No wheezes. Abdomen: soft, nontender, nondistended. Normal bowel sounds.  No appreciable masses  Extremities: warm, well perfused, cap refill < 2 sec.   Musculoskeletal: Normal muscle mass.  Normal strength Skin: warm, dry.  No rash or lesions. Neurologic: alert and oriented, normal speech, no tremor    Laboratory Evaluation: Results for orders placed or performed during the hospital encounter of 02/02/23  SARS CORONAVIRUS 2 (TAT 6-24 HRS) Anterior Nasal Swab   Specimen: Anterior Nasal Swab  Result Value Ref Range   SARS Coronavirus 2 NEGATIVE NEGATIVE     Assessment/Plan: Derrick Howard is a 22 y.o. male with Derrick Howard is a 22 y.o. male with hypothyroidism. [name] has continued healthy lifestyle habits, hemoglobin A1c is 5% today. He is clinically euthyroid on 68 mcg of levothyroxine per day.   Hypothyroidism  -68 mcg of levothyroxine per day  - TSH, FT4 and T4 ordered  - discussed s/s of hypothyroidism> encouraged to contact me between visits if needed.    2. Family history of T2DM - Hemoglobin A1c and glucose as above.   Follow-up:   6 months.   Medical decision-making:  >30  spent today reviewing the medical chart,  counseling the patient/family, and documenting today's visit.    Gretchen Short,  FNP-C  P[facility] Specialist  [address]  [place], [address]  Tele: [phone]

## 2023-04-11 ENCOUNTER — Ambulatory Visit (INDEPENDENT_AMBULATORY_CARE_PROVIDER_SITE_OTHER): Payer: Medicaid Other | Admitting: Family

## 2023-04-11 ENCOUNTER — Encounter (INDEPENDENT_AMBULATORY_CARE_PROVIDER_SITE_OTHER): Payer: Self-pay | Admitting: Family

## 2023-04-11 VITALS — BP 116/70 | HR 82 | Ht 67.32 in | Wt 186.0 lb

## 2023-04-11 DIAGNOSIS — E039 Hypothyroidism, unspecified: Secondary | ICD-10-CM

## 2023-04-11 DIAGNOSIS — Z833 Family history of diabetes mellitus: Secondary | ICD-10-CM | POA: Diagnosis not present

## 2023-04-11 NOTE — Patient Instructions (Signed)
It was a pleasure seeing you in clinic today. Please do not hesitate to contact me if you have questions or concerns.   Please sign up for MyChart. This is a communication tool that allows you to send an email directly to me. This can be used for questions, prescriptions and blood sugar reports. We will also release labs to you with instructions on MyChart. Please do not use MyChart if you need immediate or emergency assistance. Ask our wonderful front office staff if you need assistance.   -Take your medication at the same time every day -Try to take it on an empty stomach -If you forget to take a dose, take it as soon as you remember.  If you don't remember until the next day, take 2 doses then.  NEVER take more than 2 doses at a time. -Use a pill box to help make it easier to keep track of doses    - labs today

## 2023-04-11 NOTE — Progress Notes (Signed)
Pediatric Endocrinology Consultation Initial Visit  Pawan, Dame July 19, 2001  Pcp, No  Chief Complaint: Elevated TSH. Weight gain   History obtained from: [name] and his Grandmother, and review of records from PCP  HPI: Derrick Howard  is a 22 y.o. male being seen in consultation at the request of  Pcp, No for evaluation of the above concerns.  he is accompanied to this visit by his Grandmother.   1.  Zackary was seen by his PCP on 11/2019 for a [facility] where he was noted to have significant weight gain of over 50 pounds in 1 year period along with fatigue. Labs were drawn which showed elevated TSH of 5.10 with normal FT4 of 1.1 and negative TPO antibody.   he is referred to [facility] (Pediatric Endocrinology) for further evaluation elevated TSH and weight gain.  Growth Chart from PCP was reviewed and showed weight was at 129 lbs on 08/2018 which was 49th%ile. On 11/2019 weight has increased to 183 lbs and is 91.45%ile. He has had good height growth currently 16.63%ile which is on track for MPH.     2. Since his last visit to clinic on 08/2022, he has been well.   He report he has missed his past 2 visits to car trouble. He is helping take care of his younger sister in his free time.   He reports missing 2 weeks of levothyroxine 1 month ago because he lost his medication. He has been back on levothyroxine for 5-6 days.    Thyroid symptoms: Heat or cold intolerance: No  Weight changes: Weight has increased 3lb since last visit.  Energy level: Lower recently  Sleep: Sleeps well.  Skin changes: No   Constipation/Diarrhea: Denies  Difficulty swallowing: No  Neck swelling: No      ROS: All systems reviewed with pertinent positives listed below; otherwise negative. Constitutional: Sleeping well HEENT: No vision changes. No neck pain. No difficulty swallowing.  Respiratory: No increased work of breathing currently Cardiac: No tachycardia. No chest pain.  GI: No  constipation or diarrhea GU:  No polyuria.  Musculoskeletal: No joint deformity Neuro: Normal affect. No tremors or headache.  Endocrine: As above   Past Medical History:  Past Medical History:  Diagnosis Date   ADHD (attention deficit hyperactivity disorder)    Pneumonia    Pneumonia    Thyroid disease     Birth History: Pregnancy uncomplicated. Delivered at term Discharged home with mom  Meds: Outpatient Encounter Medications as of 04/11/2023  Medication Sig   levothyroxine (SYNTHROID) 137 MCG tablet Take 0.5 tablets (68 mcg total) by mouth daily before breakfast.   No facility-administered encounter medications on file as of 04/11/2023.    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  Hypothyroidism in Paternal Grandmother   Maternal height: 77ft 2in, Paternal height 62ft 10in    Social History: Lives with: Maternal Grandmother  Working.   Physical Exam:  Vitals:   04/11/23 0943  BP: 116/70  Pulse: 82  Weight: 186 lb (84.4 kg)  Height: 5' 7.32" (1.71 m)    Body mass index: body mass index is 28.85 kg/m. Growth %ile SmartLinks can only be used for patients less than 20 years old.  Wt Readings from Last 3 Encounters:  04/11/23 186 lb (84.4 kg)  02/02/23 177  lb (80.3 kg) (77%, Z= 0.75)*  09/13/22 185 lb 3.2 oz (84 kg) (85%, Z= 1.04)*   * Growth percentiles are based on CDC (Boys, 2-20 Years) data.   Ht Readings from Last 3 Encounters:  04/11/23 5' 7.32" (1.71 m)  02/02/23 5\' 8"  (1.727 m) (28%, Z= -0.58)*  06/23/22 5\' 6"  (1.676 m) (10%, Z= -1.26)*   * Growth percentiles are based on CDC (Boys, 2-20 Years) data.     Facility age limit for growth %iles is 20 years. Facility age limit for growth %iles is 20 years. Facility age limit for growth %iles  is 20 years.  General: Well developed, well nourished male in no acute distress.  Head: Normocephalic, atraumatic.   Eyes:  Pupils equal and round. EOMI.  Sclera white.  No eye drainage.   Ears/Nose/Mouth/Throat: Nares patent, no nasal drainage.  Normal dentition, mucous membranes moist.  Neck: supple, no cervical lymphadenopathy, no thyromegaly Cardiovascular: regular rate, normal S1/S2, no murmurs Respiratory: No increased work of breathing.  Lungs clear to auscultation bilaterally.  No wheezes. Abdomen: soft, nontender, nondistended. Normal bowel sounds.  No appreciable masses  Extremities: warm, well perfused, cap refill < 2 sec.   Musculoskeletal: Normal muscle mass.  Normal strength Skin: warm, dry.  No rash or lesions. Neurologic: alert and oriented, normal speech, no tremor   Laboratory Evaluation: Results for orders placed or performed during the hospital encounter of 02/02/23  SARS CORONAVIRUS 2 (TAT 6-24 HRS) Anterior Nasal Swab   Specimen: Anterior Nasal Swab  Result Value Ref Range   SARS Coronavirus 2 NEGATIVE NEGATIVE     Assessment/Plan: Camille Shillito is a 22 y.o. male with Zaki Plasky is a 22 y.o. male with hypothyroidism. He has missed 2 weeks of levothyroxine but otherwise tries to take consistently. He is clinically euthyroid on levothyroxine therapy.   Hypothyroidism  - 68 mcg of levothyroxine per day  - - TSH, FT4, and t4 ordered  - Reviewed s/s of hypothyroidism and encouraged to contact me as needed.   2. Family history of T2DM - Repeat hemoglobin A1c annually.  - Encouraged daily exercise and healthy diet.   Follow-up:   6 months.   Medical decision-making:  LOS; >30  spent today reviewing the medical chart, counseling the patient/family, and documenting today's visit.     Gretchen Short,  FNP-C  Pediatric Specialist  [address]  [place], [address]  Tele: [phone]

## 2023-04-12 LAB — T4, FREE: Free T4: 1.3 ng/dL (ref 0.8–1.4)

## 2023-04-12 LAB — T4: T4, Total: 8.3 ug/dL (ref 5.1–10.3)

## 2023-04-12 LAB — TSH: TSH: 5.24 m[IU]/L — ABNORMAL HIGH (ref 0.40–4.50)

## 2023-05-17 ENCOUNTER — Encounter (HOSPITAL_COMMUNITY): Payer: Self-pay | Admitting: *Deleted

## 2023-05-17 ENCOUNTER — Emergency Department (HOSPITAL_COMMUNITY)
Admission: EM | Admit: 2023-05-17 | Discharge: 2023-05-17 | Disposition: A | Discharge status: Home / Self Care | Payer: Medicaid Other | Attending: Emergency Medicine | Admitting: Emergency Medicine

## 2023-05-17 ENCOUNTER — Other Ambulatory Visit: Payer: Self-pay

## 2023-05-17 DIAGNOSIS — U071 COVID-19: Secondary | ICD-10-CM | POA: Insufficient documentation

## 2023-05-17 DIAGNOSIS — R059 Cough, unspecified: Secondary | ICD-10-CM | POA: Diagnosis present

## 2023-05-17 LAB — SARS CORONAVIRUS 2 BY RT PCR: SARS Coronavirus 2 by RT PCR: POSITIVE — AB

## 2023-05-17 LAB — GROUP A STREP BY PCR: Group A Strep by PCR: NOT DETECTED

## 2023-05-17 MED ORDER — NAPROXEN 500 MG PO TABS: 500.0000 mg | ORAL_TABLET | Freq: Two times a day (BID) | ORAL | 0 refills | Status: AC

## 2023-05-17 NOTE — ED Provider Notes (Signed)
[facility]EMERGENCY DEPT [facility] Emergency Department Provider Note MRN:  [id number]  Arrival date & time: 05/17/23     Chief Complaint   Cough   History of Present Illness   Derrick Howard is a 22 y.o. year-old male with no pertinent past medical history presenting to the ED with chief complaint of cough.  Cough, sore throat, body aches, subjective fever, chills, headache.  2 days.  Review of Systems  A thorough review of systems was obtained and all systems are negative except as noted in the HPI and PMH.   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,     Family History  Problem Relation Age of Onset   Hypertension Maternal Grandmother    Asthma Maternal Grandmother    Cancer Maternal Grandmother    Sleep apnea Maternal Grandmother     Social History   Socioeconomic History   Marital status: Single    Spouse name: Not on file   Number of children: Not on file   Years of education: Not on file   Highest education level: Not on file  Occupational History   Not on file  Tobacco Use   Smoking status: Never   Smokeless tobacco: Never  Vaping Use   Vaping status: Never Used  Substance and Sexual Activity   Alcohol use: Yes   Drug use: Yes    Frequency: 2.0 times per week    Types: Marijuana   Sexual activity: Never  Other Topics Concern   Not on file  Social History Narrative   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 No plans for the fall yet.   Playing games. Reading   Social Determinants of Health   Financial Resource Strain: Not on file  Food Insecurity: Not on file  Transportation Needs: Not on file  Physical Activity: Not on file  Stress: Not on file  Social Connections: Not on file  Intimate Partner Violence: Not on file      Physical Exam   Vitals:   05/17/23 0205 05/17/23 0448  BP: (!) 153/100   Pulse: (!) 115   Resp: 16   Temp: 99.2 F (37.3 C) 98.6 F (37 C)  SpO2: 98% 99%    CONSTITUTIONAL: Well-appearing, NAD NEURO/PSYCH:  Alert and oriented x 3, no focal deficits, no meningismus EYES:  eyes equal and reactive ENT/NECK:  no LAD, no JVD CARDIO: Regular rate, well-perfused, normal S1 and S2 PULM:  CTAB no wheezing or rhonchi GI/GU:  non-distended, non-tender MSK/SPINE:  No gross deformities, no edema SKIN:  no rash, atraumatic   *Additional and/or pertinent findings included in MDM below  Diagnostic and Interventional Summary    EKG Interpretation Date/Time:    Ventricular Rate:    PR Interval:    QRS Duration:    QT Interval:    QTC Calculation:   R Axis:      Text Interpretation:         Labs Reviewed  SARS CORONAVIRUS 2 BY RT PCR - Abnormal; Notable for the following components:      Result Value   SARS Coronavirus 2 by RT PCR POSITIVE (*)    All other components within normal limits  GROUP A STREP BY PCR  No orders to display    Medications - No data to display   Procedures  /  Critical Care Procedures  ED Course and Medical Decision Making  Initial Impression and Ddx Suspect viral illness, also considering a strep throat.  Mild erythema to the posterior oropharynx but no signs of abscess or more significant contiguous infection.  Past medical/surgical history that increases complexity of ED encounter: None  Interpretation of Diagnostics COVID-positive, strep negative  Patient Reassessment and Ultimate Disposition/Management     Discharge  Patient management required discussion with the following services or consulting groups:  None  Complexity of Problems Addressed Acute complicated illness or Injury  Additional Data Reviewed and Analyzed Further history obtained from: Further history from spouse/family member  Additional Factors Impacting ED  Encounter Risk Prescriptions  Elmer Sow. Pilar Plate, MD [facility] Emergency Medicine [facility] [email or web address]  Final Clinical Impressions(s) / ED Diagnoses     ICD-10-CM   1. COVID-19  U07.1       ED Discharge Orders          Ordered    naproxen (NAPROSYN) 500 MG tablet  2 times daily        05/17/23 0650             Discharge Instructions Discussed with and Provided to Patient:     Discharge Instructions      You were evaluated in the Emergency Department and after careful evaluation, we did not find any emergent condition requiring admission or further testing in the hospital.  Your exam/testing today is overall reassuring.  You tested positive for COVID-19 here in the emergency department.  Use the Naprosyn anti-inflammatory twice daily for discomfort.  Plenty of fluids and rest.  Please return to the Emergency Department if you experience any worsening of your condition.   Thank you for allowing [name] to be a part of your care.       Sabas Sous, MD 05/17/23 [phone]

## 2023-05-17 NOTE — ED Triage Notes (Signed)
The pt has body aches for 2-3 days cough headache with movement

## 2023-05-17 NOTE — Discharge Instructions (Addendum)
You were evaluated in the Emergency Department and after careful evaluation, we did not find any emergent condition requiring admission or further testing in the hospital.  Your exam/testing today is overall reassuring.  You tested positive for COVID-19 here in the emergency department.  Use the Naprosyn anti-inflammatory twice daily for discomfort.  Plenty of fluids and rest.  Please return to the Emergency Department if you experience any worsening of your condition.   Thank you for allowing [name] to be a part of your care.

## 2023-08-11 ENCOUNTER — Ambulatory Visit (INDEPENDENT_AMBULATORY_CARE_PROVIDER_SITE_OTHER): Payer: Medicaid Other | Admitting: Family

## 2023-10-08 ENCOUNTER — Telehealth (INDEPENDENT_AMBULATORY_CARE_PROVIDER_SITE_OTHER): Payer: Self-pay | Admitting: Family

## 2023-10-08 DIAGNOSIS — E039 Hypothyroidism, unspecified: Secondary | ICD-10-CM

## 2023-10-08 NOTE — Telephone Encounter (Signed)
Derrick Howard Derrick Howard) called to see if patient could get refill for levothyroxine until next app 4/4 (that was next available). She states if so it needs to be sent to [facility] on [address]. Would like a call back to confirm. [phone].

## 2023-10-10 MED ORDER — LEVOTHYROXINE SODIUM 137 MCG PO TABS: 68.0000 ug | ORAL_TABLET | Freq: Every day | ORAL | 1 refills | Status: DC

## 2023-10-10 NOTE — Addendum Note (Signed)
Addended by: Lattie Corns on: 10/10/2023 09:58 AM   Modules accepted: Orders

## 2023-10-10 NOTE — Telephone Encounter (Signed)
Called grandma back, let her know I did send in enough refills till his appointment 11/28/23. I also relayed to grandma he has to be seen at his appointment. Grandma verbalized good understanding

## 2023-11-28 ENCOUNTER — Encounter (INDEPENDENT_AMBULATORY_CARE_PROVIDER_SITE_OTHER): Payer: Self-pay | Admitting: Family

## 2023-11-28 ENCOUNTER — Ambulatory Visit (INDEPENDENT_AMBULATORY_CARE_PROVIDER_SITE_OTHER): Payer: Medicaid Other | Admitting: Family

## 2023-11-28 VITALS — BP 122/84 | HR 89 | Wt 185.0 lb

## 2023-11-28 DIAGNOSIS — Z833 Family history of diabetes mellitus: Secondary | ICD-10-CM

## 2023-11-28 DIAGNOSIS — E039 Hypothyroidism, unspecified: Secondary | ICD-10-CM | POA: Diagnosis not present

## 2023-11-28 NOTE — Progress Notes (Signed)
 Pediatric Endocrinology Consultation Initial Visit  Derrick Howard April 28, 2001  Pcp, No  Chief Complaint: Elevated TSH. Weight gain   History obtained from: Derrick Howard and his Grandmother, and review of records from PCP  HPI: Derrick Howard  is a 23 y.o. male being seen in consultation at the request of  Pcp, No for evaluation of the above concerns.  he is accompanied to this visit by his Grandmother.   1.  Derrick Howard was seen by his PCP on 11/2019 for a [facility] where he was noted to have significant weight gain of over 50 pounds in 1 year period along with fatigue. Labs were drawn which showed elevated TSH of 5.10 with normal FT4 of 1.1 and negative TPO antibody.   [facility] is [facility] ([facility]) for further evaluation elevated TSH and weight gain.  Growth Chart from PCP was reviewed and showed weight was at 129 lbs on 08/2018 which was 49th%ile. On 11/2019 weight has increased to 183 lbs and is 91.45%ile. He has had good height growth currently 16.63%ile which is on track for MPH.     2. Since his last visit to clinic on 03/2023, he has been well.   He is working full time at [employer] in [employer]. He stays active going for walks a few days per week and does at least 10 push ups per day. He eats balanced diet but goes out to eat a few x per week. Denies polyuria and polydipsia.   Takes 68 mcg of levothyroxine every morning, rarely missing a dose.    Thyroid symptoms: Heat or cold intolerance:  Denies  Weight changes: Weight has increased 3lb since last visit.  Energy level: Good, denies fatigue  Sleep: Sleeps well.  Skin changes: No dry skin or hair loss.  Constipation/Diarrhea: Denies  Difficulty swallowing: No  Neck swelling: No      ROS: All systems reviewed with pertinent positives listed below; otherwise negative. Constitutional: Weight as above.  Sleeping well HEENT: No vision changes or difficulty swallowing.  Respiratory: No increased work of  breathing currently GI: No constipation or diarrhea GU: No polyuria or nocturia.  Musculoskeletal: No joint deformity Neuro: Normal affect Endocrine: As above    Past Medical History:  Past Medical History:  Diagnosis Date   ADHD (attention deficit hyperactivity disorder)    Pneumonia    Pneumonia    Thyroid disease     Birth History: Pregnancy uncomplicated. Delivered at term Discharged home with mom  Meds: Outpatient Encounter Medications as of 11/28/2023  Medication Sig   levothyroxine (SYNTHROID) 137 MCG tablet Take 0.5 tablets (68 mcg total) by mouth daily before breakfast.   naproxen (NAPROSYN) 500 MG tablet Take 1 tablet (500 mg total) by mouth 2 (two) times daily.   No facility-administered encounter medications on file as of 11/28/2023.    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  Hypothyroidism in Paternal Grandmother   Maternal height: 67ft 2in, Paternal height 56ft 10in    Social History: Lives with: Maternal Grandmother  Working.   Physical Exam:  There were no vitals filed for this visit.   Body mass index: body mass index is unknown because there is no height or weight on file. Growth %ile SmartLinks can only be used for patients less than 20  years old.  Wt Readings from Last 3 Encounters:  05/17/23 186 lb 1.1 oz (84.4 kg)  04/11/23 186 lb (84.4 kg)  02/02/23 177 lb (80.3 kg) (77%, Z= 0.75)*   * Growth percentiles are based on CDC (Boys, 2-20 Years) data.   Ht Readings from Last 3 Encounters:  05/17/23 5\' 7"  (1.702 m)  04/11/23 5' 7.32" (1.71 m)  02/02/23 5\' 8"  (1.727 m) (28%, Z= -0.58)*   * Growth percentiles are based on CDC (Boys, 2-20 Years) data.     Facility age limit for growth %iles is 20 years. Facility age limit for growth  %iles is 20 years. No height and weight on file for this encounter.  General: Well developed, well nourished male in no acute distress.   Head: Normocephalic, atraumatic.   Eyes:  Pupils equal and round. EOMI.  Sclera white.  No eye drainage.   Ears/Nose/Mouth/Throat: Nares patent, no nasal drainage.  Normal dentition, mucous membranes moist.  Neck: supple, no cervical lymphadenopathy, no thyromegaly Cardiovascular: regular rate, normal S1/S2, no murmurs Respiratory: No increased work of breathing.  Lungs clear to auscultation bilaterally.  No wheezes. Abdomen: soft, nontender, nondistended. Normal bowel sounds.  No appreciable masses  Extremities: warm, well perfused, cap refill < 2 sec.   Musculoskeletal: Normal muscle mass.  Normal strength Skin: warm, dry.  No rash or lesions. Neurologic: alert and oriented, normal speech, no tremor   Laboratory Evaluation: Results for orders placed or performed during the hospital encounter of 05/17/23  SARS Coronavirus 2 by RT PCR (hospital order, performed in [facility] hospital lab) *cepheid single result test* Anterior Nasal Swab   Collection Time: 05/17/23  4:51 AM   Specimen: Anterior Nasal Swab  Result Value Ref Range   SARS Coronavirus 2 by RT PCR POSITIVE (A) NEGATIVE  Group A Strep by PCR   Collection Time: 05/17/23  5:31 AM   Specimen: Throat; Sterile Swab  Result Value Ref Range   Group A Strep by PCR NOT DETECTED NOT DETECTED     Assessment/Plan: Derrick Howard is a 23 y.o. male with Derrick Howard is a 23 y.o. male with hypothyroidism. He is clinically euthyroid on levothyroxine.   Hypothyroidism  - Discussed s/s of hypothyroidism and when to contact provider.  - 68 mcg of levothyroxine daily  - Refer to adult endocrinology. Discussed today.  Lab Orders         TSH         T4, free         T4         Hemoglobin A1c      .   2. Family history of T2DM - hemoglobin A1c ordered.  -Eliminate sugary drinks (regular  soda, juice, sweet tea, regular gatorade) from your diet -Drink water or milk (preferably 1% or skim) -Avoid fried foods and junk food (chips, cookies, candy) -Watch portion sizes -Pack your lunch for school -Try to get 30 minutes of activity daily   Follow-up:   6 months.   Medical decision-making:  LOS; 30 minutes  spent today reviewing the medical chart, counseling the patient/family, and documenting today's visit.    Gretchen Short, DNP, FNP-C  [facility]  [address]  [place], [address]  Tele: [phone]

## 2023-11-28 NOTE — Patient Instructions (Signed)
 It was a pleasure seeing you in clinic today. Please do not hesitate to contact me if you have questions or concerns.   Please sign up for MyChart. This is a communication tool that allows you to send an email directly to me. This can be used for questions, prescriptions and blood sugar reports. We will also release labs to you with instructions on MyChart. Please do not use MyChart if you need immediate or emergency assistance. Ask our wonderful front office staff if you need assistance.   -Take your medication at the same time every day -Try to take it on an empty stomach -If you forget to take a dose, take it as soon as you remember.  If you don't remember until the next day, take 2 doses then.  NEVER take more than 2 doses at a time. -Use a pill box to help make it easier to keep track of doses   - Refer to [facility] endocrinology

## 2023-11-29 LAB — T4: T4, Total: 8.6 ug/dL (ref 5.1–10.3)

## 2023-11-29 LAB — HEMOGLOBIN A1C
Hgb A1c MFr Bld: 4.9 %{Hb} (ref <5.7)
Mean Plasma Glucose: 94 mg/dL
eAG (mmol/L): 5.2 mmol/L

## 2023-11-29 LAB — T4, FREE: Free T4: 1.3 ng/dL (ref 0.8–1.4)

## 2023-11-29 LAB — TSH: TSH: 1.03 m[IU]/L (ref 0.40–4.50)

## 2023-12-01 ENCOUNTER — Encounter (INDEPENDENT_AMBULATORY_CARE_PROVIDER_SITE_OTHER): Payer: Self-pay

## 2023-12-01 ENCOUNTER — Other Ambulatory Visit (INDEPENDENT_AMBULATORY_CARE_PROVIDER_SITE_OTHER): Payer: Self-pay | Admitting: Family

## 2023-12-01 DIAGNOSIS — E039 Hypothyroidism, unspecified: Secondary | ICD-10-CM

## 2023-12-01 MED ORDER — LEVOTHYROXINE SODIUM 137 MCG PO TABS: 68.0000 ug | ORAL_TABLET | Freq: Every day | ORAL | 1 refills | Status: AC

## 2023-12-02 ENCOUNTER — Encounter (INDEPENDENT_AMBULATORY_CARE_PROVIDER_SITE_OTHER): Payer: Self-pay

## 2023-12-15 ENCOUNTER — Encounter (INDEPENDENT_AMBULATORY_CARE_PROVIDER_SITE_OTHER): Payer: Self-pay

## 2024-08-05 ENCOUNTER — Ambulatory Visit (HOSPITAL_COMMUNITY)

## 2024-11-01 ENCOUNTER — Ambulatory Visit: Admitting: "Endocrinology
